# Patient Record
Sex: Male | Born: 1985 | Race: White | Hispanic: No | Marital: Married | Smoking: Former smoker
Health system: Southern US, Community
[De-identification: ages and names within clinical notes are randomized; demographics above are authoritative.]

## PROBLEM LIST (undated history)

## (undated) DIAGNOSIS — E785 Hyperlipidemia, unspecified: Secondary | ICD-10-CM

## (undated) DIAGNOSIS — R569 Unspecified convulsions: Secondary | ICD-10-CM

## (undated) DIAGNOSIS — E881 Lipodystrophy, not elsewhere classified: Secondary | ICD-10-CM

## (undated) DIAGNOSIS — R591 Generalized enlarged lymph nodes: Secondary | ICD-10-CM

## (undated) HISTORY — DX: Hyperlipidemia, unspecified: E78.5

## (undated) HISTORY — DX: Lipodystrophy, not elsewhere classified: E88.1

## (undated) HISTORY — DX: Unspecified convulsions: R56.9

## (undated) HISTORY — DX: Generalized enlarged lymph nodes: R59.1

---

## 2007-12-02 LAB — BASIC METABOLIC PANEL
BUN: 8 mg/dL (ref 4–21)
CREATININE: 0.9 mg/dL (ref 0.6–1.3)
GLUCOSE: 83 mg/dL
Potassium: 4.6 mmol/L (ref 3.4–5.3)
Sodium: 139 mmol/L (ref 137–147)

## 2007-12-08 DIAGNOSIS — Z6834 Body mass index (BMI) 34.0-34.9, adult: Secondary | ICD-10-CM | POA: Insufficient documentation

## 2008-04-27 LAB — LIPID PANEL
Cholesterol: 189 mg/dL (ref 0–200)
HDL: 33 mg/dL — AB (ref 35–70)
LDL CALC: 110 mg/dL
Triglycerides: 229 mg/dL — AB (ref 40–160)

## 2008-04-27 LAB — TSH: TSH: 2.64 u[IU]/mL (ref 0.41–5.90)

## 2011-07-17 HISTORY — PX: WISDOM TOOTH EXTRACTION: SHX21

## 2013-05-08 ENCOUNTER — Ambulatory Visit: Payer: Self-pay | Admitting: Podiatry

## 2015-04-01 DIAGNOSIS — R591 Generalized enlarged lymph nodes: Secondary | ICD-10-CM | POA: Insufficient documentation

## 2015-04-05 ENCOUNTER — Encounter: Payer: Self-pay | Admitting: Family Medicine

## 2020-02-22 DIAGNOSIS — O09212 Supervision of pregnancy with history of pre-term labor, second trimester: Secondary | ICD-10-CM | POA: Diagnosis not present

## 2020-02-25 DIAGNOSIS — O09212 Supervision of pregnancy with history of pre-term labor, second trimester: Secondary | ICD-10-CM | POA: Diagnosis not present

## 2021-12-14 ENCOUNTER — Ambulatory Visit (INDEPENDENT_AMBULATORY_CARE_PROVIDER_SITE_OTHER)
Admission: RE | Admit: 2021-12-14 | Discharge: 2021-12-14 | Disposition: A | Discharge status: Home / Self Care | Payer: BC Managed Care – PPO | Source: Ambulatory Visit | Attending: Nurse Practitioner | Admitting: Nurse Practitioner

## 2021-12-14 ENCOUNTER — Ambulatory Visit: Payer: BC Managed Care – PPO | Admitting: Nurse Practitioner

## 2021-12-14 ENCOUNTER — Encounter: Payer: Self-pay | Admitting: Nurse Practitioner

## 2021-12-14 VITALS — BP 122/84 | HR 98 | Temp 98.0°F | Ht 73.5 in | Wt 319.0 lb

## 2021-12-14 DIAGNOSIS — Z23 Encounter for immunization: Secondary | ICD-10-CM

## 2021-12-14 DIAGNOSIS — Z6841 Body Mass Index (BMI) 40.0 and over, adult: Secondary | ICD-10-CM | POA: Diagnosis not present

## 2021-12-14 DIAGNOSIS — Z0000 Encounter for general adult medical examination without abnormal findings: Secondary | ICD-10-CM

## 2021-12-14 DIAGNOSIS — G8929 Other chronic pain: Secondary | ICD-10-CM

## 2021-12-14 DIAGNOSIS — Z720 Tobacco use: Secondary | ICD-10-CM

## 2021-12-14 DIAGNOSIS — E66813 Obesity, class 3: Secondary | ICD-10-CM | POA: Insufficient documentation

## 2021-12-14 DIAGNOSIS — Z6834 Body mass index (BMI) 34.0-34.9, adult: Secondary | ICD-10-CM

## 2021-12-14 DIAGNOSIS — M545 Low back pain, unspecified: Secondary | ICD-10-CM

## 2021-12-14 DIAGNOSIS — R0683 Snoring: Secondary | ICD-10-CM | POA: Insufficient documentation

## 2021-12-14 DIAGNOSIS — M4316 Spondylolisthesis, lumbar region: Secondary | ICD-10-CM | POA: Diagnosis not present
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
## 2021-12-14 NOTE — Assessment & Plan Note (Signed)
Been going on for extended period time per patient report.  We will obtain lumbar pictures in office today.  No red flags on exam or acute pain today.

## 2021-12-14 NOTE — Assessment & Plan Note (Deleted)
Discussed healthy lifestyle modifications inclusive of 30 minutes of exercise 5 times weekly 

## 2021-12-14 NOTE — Patient Instructions (Signed)
Nice to see you today I will be in touch with the labs and xray once I have the results Follow up with me in 1 year, sooner if you need me The sleep study place should contact you in 2 weeks to schedule an appointment

## 2021-12-14 NOTE — Assessment & Plan Note (Signed)
Patient used to smoke traditional cigarettes currently vaping at this time

## 2021-12-14 NOTE — Assessment & Plan Note (Signed)
Discussed healthy lifestyle modifications inclusive of 30 minutes of exercise 5 times weekly.

## 2021-12-14 NOTE — Assessment & Plan Note (Signed)
Discussed age-appropriate immunizations and screening exams with patient in office.  Appropriate orders placed today

## 2021-12-14 NOTE — Assessment & Plan Note (Signed)
Patient does snore and grinds his teeth.  States his wife is a Armed forces operational officer and that she and the dentist the patient sees recommend sleep apnea rule out.  Ambulatory referral placed to [facility] pulmonology in [place]

## 2021-12-15 LAB — COMPREHENSIVE METABOLIC PANEL
ALT: 53 U/L (ref 0–53)
AST: 36 U/L (ref 0–37)
Albumin: 4.3 g/dL (ref 3.5–5.2)
Alkaline Phosphatase: 62 U/L (ref 39–117)
BUN: 13 mg/dL (ref 6–23)
CO2: 28 mEq/L (ref 19–32)
Calcium: 9.2 mg/dL (ref 8.4–10.5)
Chloride: 104 mEq/L (ref 96–112)
Creatinine, Ser: 1.01 mg/dL (ref 0.40–1.50)
GFR: 96.97 mL/min (ref >60.00)
Glucose, Bld: 105 mg/dL — ABNORMAL HIGH (ref 70–99)
Potassium: 4.1 mEq/L (ref 3.5–5.1)
Sodium: 139 mEq/L (ref 135–145)
Total Bilirubin: 0.7 mg/dL (ref 0.2–1.2)
Total Protein: 7.4 g/dL (ref 6.0–8.3)

## 2021-12-15 LAB — CBC
HCT: 46.2 % (ref 39.0–52.0)
Hemoglobin: 15.9 g/dL (ref 13.0–17.0)
MCHC: 34.4 g/dL (ref 30.0–36.0)
MCV: 91.7 fl (ref 78.0–100.0)
Platelets: 262 10*3/uL (ref 150.0–400.0)
RBC: 5.04 Mil/uL (ref 4.22–5.81)
RDW: 12.8 % (ref 11.5–15.5)
WBC: 8.6 10*3/uL (ref 4.0–10.5)

## 2021-12-15 LAB — LIPID PANEL
Cholesterol: 181 mg/dL (ref 0–200)
HDL: 34.9 mg/dL — ABNORMAL LOW (ref >39.00)
NonHDL: 146.02
Total CHOL/HDL Ratio: 5
Triglycerides: 217 mg/dL — ABNORMAL HIGH (ref 0.0–149.0)
VLDL: 43.4 mg/dL — ABNORMAL HIGH (ref 0.0–40.0)

## 2021-12-15 LAB — TSH: TSH: 1.49 u[IU]/mL (ref 0.35–5.50)

## 2021-12-15 LAB — HEMOGLOBIN A1C: Hgb A1c MFr Bld: 5.4 % (ref 4.6–6.5)

## 2021-12-15 LAB — LDL CHOLESTEROL, DIRECT: Direct LDL: 125 mg/dL

## 2022-01-19 ENCOUNTER — Ambulatory Visit (INDEPENDENT_AMBULATORY_CARE_PROVIDER_SITE_OTHER): Payer: BC Managed Care – PPO | Admitting: Primary Care

## 2022-01-19 ENCOUNTER — Encounter: Payer: Self-pay | Admitting: Primary Care

## 2022-01-19 VITALS — BP 122/70 | HR 61 | Temp 97.8°F | Ht 73.5 in | Wt 316.0 lb

## 2022-01-19 DIAGNOSIS — R0683 Snoring: Secondary | ICD-10-CM

## 2022-01-19 NOTE — Assessment & Plan Note (Signed)
-   Patient has symptoms of loud snoring.  Associated daytime sleepiness.  Epworth score is 10.  BMI is 41.  Concern patient could have underlying sleep apnea, needs home sleep study to evaluate.  Discussed risk of untreated sleep apnea including cardiac arrhythmias, stroke, pulmonary hypertension and diabetes.  We also briefly reviewed treatment options including weight loss, oral appliance, CPAP therapy or referral to ENT for possible surgical options. Recommend patient focus on side sleeping position or elevate head of bed 30 degrees.  Encourage weight loss efforts.  Advised against driving if experiencing excessive daytime sleepiness or fatigue.  Follow-up in 6 weeks to review sleep study results and possible treatment options if needed.

## 2022-01-19 NOTE — Progress Notes (Signed)
@Patient  ID: , male    DOB: 06/30/1986, 36 y.o.   MRN: [id number]  Chief Complaint  Patient presents with   sleep consult    No prior sleep study- loud snoring  and occ daytime sleepiness.     Referring provider: [id number], NP  HPI: 36 year old male, former smoker quit in 2013.  Past medical history significant for chronic back pain and obesity.   01/19/2022 Patient presents today for sleep consult.  He has symptoms of snoring. He has had two prior episodes in the alst several years where he reports waking up choking/struggling to breath. His wife is a 03/22/2022, she and his dentist are both concerned he may have possible sleep apnea. He has some mild daytime sleepiness. He grinds his teeth at night.  Typical bedtime is between 1 and 2 AM.  It takes him on average 10 to 15 minutes to fall asleep.  He does not wake up at night.  No restless sleep.  He starts his day between 730 and 8 AM.  His weight is up several pounds.  No prior sleep study.  He does not currently wear CPAP or oxygen.  Denies symptoms of narcolepsy, cataplexy or sleepwalking.  Sleep questionnaire Symptoms- snoring, grinds teeth Previous sleep study- none Typical bedtime- 1am-2am Time to fall asleep- 10-15 mins Nocturnal awakenings- 0 Start of day- 7:30-8am Weight changes- some Do you operate heavy machinery-no Do you currently wear CPAP- no Do currently wear oxygen- no Epworth score-10   No Known Allergies  Immunization History  Administered Date(s) Administered   PFIZER(Purple Top)SARS-COV-2 Vaccination 02/18/2020, 03/10/2020   Td 12/14/2021   Tdap 12/02/2007    Past Medical History:  Diagnosis Date   Hyperlipidemia    Lipodystrophy    Lymphadenopathy    Seizure (HCC)     Tobacco History: Social History   Tobacco Use  Smoking Status Former   Packs/day: 1.00   Years: 6.00   Total pack years: 6.00   Types: Cigarettes, E-cigarettes   Quit date: 07/17/2011   Years since  quitting: 10.5  Smokeless Tobacco Not on file  Tobacco Comments   Vapes   Counseling given: Not Answered Tobacco comments: Vapes   No outpatient medications prior to visit.   No facility-administered medications prior to visit.    Review of Systems  Review of Systems  Constitutional: Negative.   HENT: Negative.    Respiratory: Negative.    Cardiovascular: Negative.     Physical Exam  BP 122/70 (BP Location: Left Arm, Cuff Size: Large)   Pulse 61   Temp 97.8 F (36.6 C) (Temporal)   Ht 6' 1.5" (1.867 m)   Wt (!) 316 lb (143.3 kg)   SpO2 98%   BMI 41.13 kg/m  Physical Exam Constitutional:      Appearance: Normal appearance.  HENT:     Head: Normocephalic and atraumatic.     Mouth/Throat:     Mouth: Mucous membranes are moist.     Pharynx: Oropharynx is clear.     Comments: Mallampati class II Cardiovascular:     Rate and Rhythm: Normal rate and regular rhythm.  Pulmonary:     Effort: Pulmonary effort is normal.     Breath sounds: Normal breath sounds.  Musculoskeletal:        General: Normal range of motion.     Cervical back: Normal range of motion and neck supple.  Skin:    General: Skin is warm and dry.  Neurological:  General: No focal deficit present.     Mental Status: He is alert and oriented to person, place, and time. Mental status is at baseline.  Psychiatric:        Mood and Affect: Mood normal.        Behavior: Behavior normal.        Thought Content: Thought content normal.        Judgment: Judgment normal.      Lab Results:  CBC    Component Value Date/Time   WBC 8.6 12/14/2021 1433   RBC 5.04 12/14/2021 1433   HGB 15.9 12/14/2021 1433   HCT 46.2 12/14/2021 1433   PLT 262.0 12/14/2021 1433   MCV 91.7 12/14/2021 1433   MCHC 34.4 12/14/2021 1433   RDW 12.8 12/14/2021 1433    BMET    Component Value Date/Time   NA 139 12/14/2021 1433   NA 139 12/02/2007 0000   K 4.1 12/14/2021 1433   CL 104 12/14/2021 1433   CO2 28  12/14/2021 1433   GLUCOSE 105 (H) 12/14/2021 1433   BUN 13 12/14/2021 1433   BUN 8 12/02/2007 0000   CREATININE 1.01 12/14/2021 1433   CALCIUM 9.2 12/14/2021 1433    BNP No results found for: "BNP"  ProBNP No results found for: "PROBNP"  Imaging: No results found.   Assessment & Plan:   Snores - Patient has symptoms of loud snoring.  Associated daytime sleepiness.  Epworth score is 10.  BMI is 41.  Concern patient could have underlying sleep apnea, needs home sleep study to evaluate.  Discussed risk of untreated sleep apnea including cardiac arrhythmias, stroke, pulmonary hypertension and diabetes.  We also briefly reviewed treatment options including weight loss, oral appliance, CPAP therapy or referral to ENT for possible surgical options. Recommend patient focus on side sleeping position or elevate head of bed 30 degrees.  Encourage weight loss efforts.  Advised against driving if experiencing excessive daytime sleepiness or fatigue.  Follow-up in 6 weeks to review sleep study results and possible treatment options if needed.     Glenford Bayley, NP 01/19/2022

## 2022-01-19 NOTE — Progress Notes (Signed)
Reviewed and agree with assessment/plan.   Coralyn Helling, MD [facility] 01/19/2022, 2:45 PM Pager:  [phone]

## 2022-01-19 NOTE — Patient Instructions (Signed)
Risk of untreated sleep apnea include cardiac arrhythmias, stroke, pulmonary hypertension and diabetes  Treatment options include weight loss, oral appliance, CPAP therapy or referral to ENT for possible surgical options  Recommendations Work on weight loss efforts Focus on side sleeping position or elevate head of bed 30 degrees Do not drive experiencing excessive daytime sleepiness or fatigue  Orders Home sleep study re: morning  Follow-up 6-week virtual visit with [facility] NP to review sleep study results and treatment options if needed (call after completing sleep study to schedule visit 1 to 2 weeks after)  Sleep Apnea Sleep apnea affects breathing during sleep. It causes breathing to stop for 10 seconds or more, or to become shallow. People with sleep apnea usually snore loudly. It can also increase the risk of: Heart attack. Stroke. Being very overweight (obese). Diabetes. Heart failure. Irregular heartbeat. High blood pressure. The goal of treatment is to help you breathe normally again. What are the causes?  The most common cause of this condition is a collapsed or blocked airway. There are three kinds of sleep apnea: Obstructive sleep apnea. This is caused by a blocked or collapsed airway. Central sleep apnea. This happens when the brain does not send the right signals to the muscles that control breathing. Mixed sleep apnea. This is a combination of obstructive and central sleep apnea. What increases the risk? Being overweight. Smoking. Having a small airway. Being older. Being male. Drinking alcohol. Taking medicines to calm yourself (sedatives or tranquilizers). Having family members with the condition. Having a tongue or tonsils that are larger than normal. What are the signs or symptoms? Trouble staying asleep. Loud snoring. Headaches in the morning. Waking up gasping. Dry mouth or sore throat in the morning. Being sleepy or tired during the day. If you  are sleepy or tired during the day, you may also: Not be able to focus your mind (concentrate). Forget things. Get angry a lot and have mood swings. Feel sad (depressed). Have changes in your personality. Have less interest in sex, if you are male. Be unable to have an erection, if you are male. How is this treated?  Sleeping on your side. Using a medicine to get rid of mucus in your nose (decongestant). Avoiding the use of alcohol, medicines to help you relax, or certain pain medicines (narcotics). Losing weight, if needed. Changing your diet. Quitting smoking. Using a machine to open your airway while you sleep, such as: An oral appliance. This is a mouthpiece that shifts your lower jaw forward. A CPAP device. This device blows air through a mask when you breathe out (exhale). An EPAP device. This has valves that you put in each nostril. A BIPAP device. This device blows air through a mask when you breathe in (inhale) and breathe out. Having surgery if other treatments do not work. Follow these instructions at home: Lifestyle Make changes that your doctor recommends. Eat a healthy diet. Lose weight if needed. Avoid alcohol, medicines to help you relax, and some pain medicines. Do not smoke or use any products that contain nicotine or tobacco. If you need help quitting, ask your doctor. General instructions Take over-the-counter and prescription medicines only as told by your doctor. If you were given a machine to use while you sleep, use it only as told by your doctor. If you are having surgery, make sure to tell your doctor you have sleep apnea. You may need to bring your device with you. Keep all follow-up visits. Contact a doctor if:  The machine that you were given to use during sleep bothers you or does not seem to be working. You do not get better. You get worse. Get help right away if: Your chest hurts. You have trouble breathing in enough air. You have an  uncomfortable feeling in your back, arms, or stomach. You have trouble talking. One side of your body feels weak. A part of your face is hanging down. These symptoms may be an emergency. Get help right away. Call your local emergency services (911 in the [place] Do not wait to see if the symptoms will go away. Do not drive yourself to the hospital. Summary This condition affects breathing during sleep. The most common cause is a collapsed or blocked airway. The goal of treatment is to help you breathe normally while you sleep. This information is not intended to replace advice given to you by your health care provider. Make sure you discuss any questions you have with your health care provider. Document Revised: 02/08/2021 Document Reviewed: 06/10/2020 Elsevier Patient Education  2023 [employer].

## 2022-02-22 ENCOUNTER — Ambulatory Visit: Payer: BC Managed Care – PPO

## 2022-02-22 DIAGNOSIS — R0683 Snoring: Secondary | ICD-10-CM | POA: Diagnosis not present

## 2022-03-05 DIAGNOSIS — R0683 Snoring: Secondary | ICD-10-CM | POA: Diagnosis not present

## 2023-05-02 ENCOUNTER — Telehealth: Payer: BC Managed Care – PPO | Admitting: Physician Assistant

## 2023-05-02 DIAGNOSIS — J208 Acute bronchitis due to other specified organisms: Secondary | ICD-10-CM | POA: Diagnosis not present

## 2023-05-02 DIAGNOSIS — B9689 Other specified bacterial agents as the cause of diseases classified elsewhere: Secondary | ICD-10-CM | POA: Diagnosis not present

## 2023-05-02 MED ORDER — AZITHROMYCIN 250 MG PO TABS: ORAL_TABLET | ORAL | 0 refills | Status: AC

## 2023-05-02 MED ORDER — BENZONATATE 100 MG PO CAPS: 100.0000 mg | ORAL_CAPSULE | Freq: Three times a day (TID) | ORAL | 0 refills | Status: DC | PRN

## 2023-05-02 NOTE — Progress Notes (Signed)
E-Visit for Cough   We are sorry that you are not feeling well.  Here is how we plan to help!  Based on your presentation I believe you most likely have A cough due to bacteria.  When patients have a fever and a productive cough with a change in color or increased sputum production, we are concerned about bacterial bronchitis.  If left untreated it can progress to pneumonia.  If your symptoms do not improve with your treatment plan it is important that you contact your provider.   I have prescribed Azithromyin 250 mg: two tablets now and then one tablet daily for 4 additonal days    In addition you may use A prescription cough medication called Tessalon Perles 100mg . You may take 1-2 capsules every 8 hours as needed for your cough.   From your responses in the eVisit questionnaire you describe inflammation in the upper respiratory tract which is causing a significant cough.  This is commonly called Bronchitis and has four common causes:   Allergies Viral Infections Acid Reflux Bacterial Infection Allergies, viruses and acid reflux are treated by controlling symptoms or eliminating the cause. An example might be a cough caused by taking certain blood pressure medications. You stop the cough by changing the medication. Another example might be a cough caused by acid reflux. Controlling the reflux helps control the cough.  USE OF BRONCHODILATOR ("RESCUE") INHALERS: There is a risk from using your bronchodilator too frequently.  The risk is that over-reliance on a medication which only relaxes the muscles surrounding the breathing tubes can reduce the effectiveness of medications prescribed to reduce swelling and congestion of the tubes themselves.  Although you feel brief relief from the bronchodilator inhaler, your asthma may actually be worsening with the tubes becoming more swollen and filled with mucus.  This can delay other crucial treatments, such as oral steroid medications. If you need to use  a bronchodilator inhaler daily, several times per day, you should discuss this with your provider.  There are probably better treatments that could be used to keep your asthma under control.     HOME CARE Only take medications as instructed by your medical team. Complete the entire course of an antibiotic. Drink plenty of fluids and get plenty of rest. Avoid close contacts especially the very young and the elderly Cover your mouth if you cough or cough into your sleeve. Always remember to wash your hands A steam or ultrasonic humidifier can help congestion.   GET HELP RIGHT AWAY IF: You develop worsening fever. You become short of breath You cough up blood. Your symptoms persist after you have completed your treatment plan MAKE SURE YOU  Understand these instructions. Will watch your condition. Will get help right away if you are not doing well or get worse.    Thank you for choosing an e-visit.  Your e-visit answers were reviewed by a board certified advanced clinical practitioner to complete your personal care plan. Depending upon the condition, your plan could have included both over the counter or prescription medications.  Please review your pharmacy choice. Make sure the pharmacy is open so you can pick up prescription now. If there is a problem, you may contact your provider through [employer] and have the prescription routed to another pharmacy.  Your safety is important to [place]. If you have drug allergies check your prescription carefully.   For the next 24 hours you can use MyChart to ask questions about today's visit, request a  non-urgent call back, or ask for a work or school excuse. You will get an email in the next two days asking about your experience. I hope that your e-visit has been valuable and will speed your recovery.

## 2023-05-02 NOTE — Progress Notes (Signed)
I have spent 5 minutes in review of e-visit questionnaire, review and updating patient chart, medical decision making and response to patient.   Mia Milan Cody Jacklynn Dehaas, PA-C    

## 2023-09-13 ENCOUNTER — Encounter: Payer: Self-pay | Admitting: Nurse Practitioner

## 2023-09-13 ENCOUNTER — Ambulatory Visit (INDEPENDENT_AMBULATORY_CARE_PROVIDER_SITE_OTHER): Payer: BC Managed Care – PPO | Admitting: Nurse Practitioner

## 2023-09-13 ENCOUNTER — Ambulatory Visit: Payer: Self-pay | Admitting: Nurse Practitioner

## 2023-09-13 VITALS — BP 120/88 | HR 86 | Temp 98.2°F | Ht 73.5 in | Wt 348.8 lb

## 2023-09-13 DIAGNOSIS — Z6841 Body Mass Index (BMI) 40.0 and over, adult: Secondary | ICD-10-CM

## 2023-09-13 DIAGNOSIS — Z114 Encounter for screening for human immunodeficiency virus [HIV]: Secondary | ICD-10-CM | POA: Diagnosis not present

## 2023-09-13 DIAGNOSIS — Z13220 Encounter for screening for lipoid disorders: Secondary | ICD-10-CM | POA: Diagnosis not present

## 2023-09-13 DIAGNOSIS — Z1159 Encounter for screening for other viral diseases: Secondary | ICD-10-CM

## 2023-09-13 DIAGNOSIS — Z131 Encounter for screening for diabetes mellitus: Secondary | ICD-10-CM | POA: Diagnosis not present

## 2023-09-13 DIAGNOSIS — R6882 Decreased libido: Secondary | ICD-10-CM

## 2023-09-13 DIAGNOSIS — Z0000 Encounter for general adult medical examination without abnormal findings: Secondary | ICD-10-CM

## 2023-09-13 DIAGNOSIS — Z87891 Personal history of nicotine dependence: Secondary | ICD-10-CM

## 2023-09-13 LAB — COMPREHENSIVE METABOLIC PANEL
ALT: 61 U/L — ABNORMAL HIGH (ref 0–53)
AST: 40 U/L — ABNORMAL HIGH (ref 0–37)
Albumin: 4.3 g/dL (ref 3.5–5.2)
Alkaline Phosphatase: 64 U/L (ref 39–117)
BUN: 10 mg/dL (ref 6–23)
CO2: 29 meq/L (ref 19–32)
Calcium: 9 mg/dL (ref 8.4–10.5)
Chloride: 101 meq/L (ref 96–112)
Creatinine, Ser: 0.91 mg/dL (ref 0.40–1.50)
GFR: 108.56 mL/min (ref >60.00)
Glucose, Bld: 111 mg/dL — ABNORMAL HIGH (ref 70–99)
Potassium: 4.6 meq/L (ref 3.5–5.1)
Sodium: 137 meq/L (ref 135–145)
Total Bilirubin: 0.6 mg/dL (ref 0.2–1.2)
Total Protein: 7.4 g/dL (ref 6.0–8.3)

## 2023-09-13 LAB — LIPID PANEL
Cholesterol: 167 mg/dL (ref 0–200)
HDL: 35.5 mg/dL — ABNORMAL LOW (ref >39.00)
LDL Cholesterol: 94 mg/dL (ref 0–99)
NonHDL: 131.3
Total CHOL/HDL Ratio: 5
Triglycerides: 188 mg/dL — ABNORMAL HIGH (ref 0.0–149.0)
VLDL: 37.6 mg/dL (ref 0.0–40.0)

## 2023-09-13 LAB — CBC
HCT: 48.5 % (ref 39.0–52.0)
Hemoglobin: 16.5 g/dL (ref 13.0–17.0)
MCHC: 34.1 g/dL (ref 30.0–36.0)
MCV: 93.4 fl (ref 78.0–100.0)
Platelets: 281 10*3/uL (ref 150.0–400.0)
RBC: 5.2 Mil/uL (ref 4.22–5.81)
RDW: 13.1 % (ref 11.5–15.5)
WBC: 9.5 10*3/uL (ref 4.0–10.5)

## 2023-09-13 LAB — TSH: TSH: 1.86 u[IU]/mL (ref 0.35–5.50)

## 2023-09-13 LAB — HEMOGLOBIN A1C: Hgb A1c MFr Bld: 6 % (ref 4.6–6.5)

## 2023-09-13 MED ORDER — WEGOVY 0.25 MG/0.5ML ~~LOC~~ SOAJ: 0.2500 mg | SUBCUTANEOUS | 0 refills | Status: DC

## 2023-09-13 NOTE — Assessment & Plan Note (Signed)
 Discussed age-appropriate immunizations and screening exams.  Did review patient's personal, surgical, social, family histories.  Patient is up-to-date on all age-appropriate vaccinations he would like.  Patient declined flu vaccine today.  Patient is too young for CRC screening or prostate cancer screening.  Patient was given information at discharge about preventative healthcare maintenance with anticipatory guidance

## 2023-09-13 NOTE — Patient Instructions (Addendum)
 Nice to see you today The injectable weight loss medications are Wegovy and Zepbound. If one is covered send me a Clinical cytogeneticist message and we can start it Plan a follow up in office in 3 months

## 2023-09-13 NOTE — Telephone Encounter (Signed)
 Copied from CRM [id number]. Topic: Clinical - Prescription Issue >> Sep 13, 2023  4:32 PM Alvino Blood C wrote: Reason for CRM: Patient states medication was sent to the incorrect pharmacy. Patient is requesting the following medication: Semaglutide-Weight Management (WEGOVY) 0.25 MG/0.5ML SOAJ be sent to [facility] - [place], [place] - [address]

## 2023-09-13 NOTE — Assessment & Plan Note (Signed)
 Pending TSH, A1c, lipid panel.  Patient to continue work on healthy lifestyle applications such as diet and exercise.  We did discuss injectable weight loss medications inclusive of Zepbound and [id number].  No family or personal history of medullary thyroid cancer or multiple endocrine neoplasia syndrome type II.  Patient will check with his insurance to see if one of the medications are covered

## 2023-09-13 NOTE — Progress Notes (Signed)
 Established Patient Office Visit  Subjective   Patient ID: Timothy Robles, male    DOB: [birth year]  Age: 38 y.o. MRN: [id number]  Chief Complaint  Patient presents with   Annual Exam    HPI  for complete physical and follow up of chronic conditions.  Immunizations: -Tetanus: Completed in 2023 -Influenza: refused  -Shingles: Too young -Pneumonia: Too young  Diet: Fair diet. He eats a 2 meals a day with some snacking.  Exercise: No regular exercise. In November he started working out and had lost weight. States that the house got hit with sickness and holidays. He has been watching his food over the past 1-2   Eye exam:  Glasses. Needs updating  Dental exam: Completes semi-annually    Colonoscopy: Too young, currently average risk Lung Cancer Screening: N/A  PSA: Too young.  Does have a grandparent with prostate cancer later in life unsure of father's medical history.  Sleep: goes to bed around 2am and will get up around 7. States that he does not feel rested but does not feel tired until approx 1 am   Decreased libido: states that his sex drive is lower.  He is unsure if this has anything to do his testosterone.  States that he noticed that over the past year he has not had the desire as he has had in the past.  Unsure if this is due to having a newborn kid and being tired or schedules.  Also patient spouse has been breast-feeding/pumping over the past year.   Review of Systems  Constitutional:  Negative for chills and fever.  Respiratory:  Negative for shortness of breath.   Cardiovascular:  Negative for chest pain and leg swelling.  Gastrointestinal:  Negative for abdominal pain, blood in stool, constipation, diarrhea, nausea and vomiting.       BM daily  Genitourinary:  Negative for dysuria and hematuria.  Neurological:  Positive for tingling (right knee that is random). Negative for headaches.  Psychiatric/Behavioral:  Negative for hallucinations and suicidal  ideas.       Objective:     BP 120/88   Pulse 86   Temp 98.2 F (36.8 C) (Oral)   Ht 6' 1.5" (1.867 m)   Wt (!) 348 lb 12.8 oz (158.2 kg)   SpO2 96%   BMI 45.39 kg/m  BP Readings from Last 3 Encounters:  09/13/23 120/88  01/19/22 122/70  12/14/21 122/84   Wt Readings from Last 3 Encounters:  09/13/23 (!) 348 lb 12.8 oz (158.2 kg)  01/19/22 (!) 316 lb (143.3 kg)  12/14/21 (!) 319 lb (144.7 kg)   SpO2 Readings from Last 3 Encounters:  09/13/23 96%  01/19/22 98%  12/14/21 97%      Physical Exam Vitals and nursing note reviewed.  Constitutional:      Appearance: Normal appearance.  HENT:     Right Ear: Tympanic membrane, ear canal and external ear normal.     Left Ear: Tympanic membrane, ear canal and external ear normal.     Mouth/Throat:     Mouth: Mucous membranes are moist.     Pharynx: Oropharynx is clear.  Eyes:     Extraocular Movements: Extraocular movements intact.     Pupils: Pupils are equal, round, and reactive to light.  Cardiovascular:     Rate and Rhythm: Normal rate and regular rhythm.     Pulses: Normal pulses.     Heart sounds: Normal heart sounds.  Pulmonary:     Effort:  Pulmonary effort is normal.     Breath sounds: Normal breath sounds.  Abdominal:     General: Bowel sounds are normal. There is no distension.     Palpations: There is no mass.     Tenderness: There is no abdominal tenderness.     Hernia: No hernia is present.  Musculoskeletal:     Right lower leg: No edema.     Left lower leg: No edema.  Lymphadenopathy:     Cervical: No cervical adenopathy.  Skin:    General: Skin is warm.  Neurological:     General: No focal deficit present.     Mental Status: He is alert.     Deep Tendon Reflexes:     Reflex Scores:      Bicep reflexes are 2+ on the right side and 2+ on the left side.      Patellar reflexes are 2+ on the right side and 2+ on the left side.    Comments: Bilateral upper and lower extremity strength 5/5   Psychiatric:        Mood and Affect: Mood normal.        Behavior: Behavior normal.        Thought Content: Thought content normal.        Judgment: Judgment normal.      No results found for any visits on 09/13/23.    The ASCVD Risk score (Arnett DK, et al., 2019) failed to calculate for the following reasons:   The 2019 ASCVD risk score is only valid for ages 68 to 35    Assessment & Plan:   Problem List Items Addressed This Visit       Other   Preventative health care - Primary   Discussed age-appropriate immunizations and screening exams.  Did review patient's personal, surgical, social, family histories.  Patient is up-to-date on all age-appropriate vaccinations he would like.  Patient declined flu vaccine today.  Patient is too young for CRC screening or prostate cancer screening.  Patient was given information at discharge about preventative healthcare maintenance with anticipatory guidance      Relevant Orders   CBC   Comprehensive metabolic panel   Morbid obesity (HCC)   Pending TSH, A1c, lipid panel.  Patient to continue work on healthy lifestyle applications such as diet and exercise.  We did discuss injectable weight loss medications inclusive of Zepbound and [id number].  No family or personal history of medullary thyroid cancer or multiple endocrine neoplasia syndrome type II.  Patient will check with his insurance to see if one of the medications are covered      Relevant Orders   Hemoglobin A1c   TSH   Lipid panel   Decreased sex drive   Multifactorial.  Did encourage patient to continue losing weight and work on resistance training to build muscle.  If this is a continued concern we can set up visit to have testosterone checked      Other Visit Diagnoses       Encounter for screening for HIV       Relevant Orders   HIV antibody (with reflex)     Encounter for hepatitis C screening test for low risk patient       Relevant Orders   Hepatitis C Antibody      Former tobacco use         Screening for diabetes mellitus       Relevant Orders   Hemoglobin A1c  Screening for lipid disorders       Relevant Orders   Lipid panel       Return in about 3 months (around 12/11/2023) for weight recheck .    Audria Nine, NP

## 2023-09-13 NOTE — Assessment & Plan Note (Signed)
 Multifactorial.  Did encourage patient to continue losing weight and work on resistance training to build muscle.  If this is a continued concern we can set up visit to have testosterone checked

## 2023-09-14 LAB — HEPATITIS C ANTIBODY: Hepatitis C Ab: NONREACTIVE

## 2023-09-14 LAB — HIV ANTIBODY (ROUTINE TESTING W REFLEX): HIV 1&2 Ab, 4th Generation: NONREACTIVE

## 2023-09-16 MED ORDER — WEGOVY 0.25 MG/0.5ML ~~LOC~~ SOAJ: 0.2500 mg | SUBCUTANEOUS | 0 refills | Status: DC

## 2023-09-16 NOTE — Addendum Note (Signed)
 Addended by: Eden Emms on: 09/16/2023 01:01 PM   Modules accepted: Orders

## 2023-09-17 ENCOUNTER — Other Ambulatory Visit (HOSPITAL_COMMUNITY): Payer: Self-pay

## 2023-09-17 ENCOUNTER — Telehealth: Payer: Self-pay

## 2023-09-17 ENCOUNTER — Encounter: Payer: Self-pay | Admitting: Nurse Practitioner

## 2023-09-17 NOTE — Telephone Encounter (Signed)
 Pharmacy Patient Advocate Encounter  Received notification from [facility] that Prior Authorization for [facility] 0.25MG /0.5ML auto-injectors has been APPROVED from 09/17/23 to 01/21/24. Ran test claim, Copay is $25. This test claim was processed through [facility]- copay amounts may vary at other pharmacies due to pharmacy/plan contracts, or as the patient moves through the different stages of their insurance plan.   PA #/Case ID/Reference #: [id number]

## 2023-09-17 NOTE — Telephone Encounter (Signed)
 Pharmacy Patient Advocate Encounter   Received notification from  [facility] Portal that prior authorization for [facility] 0.25MG /0.5ML auto-injectors is required/requested.   Insurance verification completed.   The patient is insured through [facility] .   Per test claim: PA required; PA submitted to above mentioned insurance via CoverMyMeds Key/confirmation #/EOC [id number] Status is pending

## 2023-09-18 ENCOUNTER — Other Ambulatory Visit (HOSPITAL_COMMUNITY): Payer: Self-pay

## 2023-10-09 MED ORDER — WEGOVY 0.5 MG/0.5ML ~~LOC~~ SOAJ: 0.5000 mg | SUBCUTANEOUS | 0 refills | Status: DC

## 2023-10-09 NOTE — Addendum Note (Signed)
 Addended by: Eden Emms on: 10/09/2023 03:21 PM   Modules accepted: Orders

## 2023-10-18 ENCOUNTER — Encounter: Payer: BC Managed Care – PPO | Admitting: Nurse Practitioner

## 2023-10-31 ENCOUNTER — Encounter: Payer: Self-pay | Admitting: Nurse Practitioner

## 2023-11-04 NOTE — Telephone Encounter (Signed)
Can we triage the my chart message please

## 2023-11-05 NOTE — Telephone Encounter (Signed)
 Called patient symptoms have improved over time. They started 9 days ago. Started with abdominal pain with nausea and vomiting with frequent loose stools. She did not take injection this Sunday as scheduled and started eating probiotic yogurt. He is still having loose stools but down to twice a day.  He has not had any vomiting after day 4 of onset. Denies any fever, or blood in stools. His main concern that it is coming from Wegovy . He would like to stay on it but he is not sure if he can continue if this was due to medication. This was his 6th injection total and 2 on increased dose. He had very mild symptoms on week 4 of the lower dose. That only lasted 2-3 days.

## 2023-11-15 ENCOUNTER — Ambulatory Visit: Admitting: Nurse Practitioner

## 2023-11-15 ENCOUNTER — Other Ambulatory Visit (HOSPITAL_COMMUNITY): Payer: Self-pay

## 2023-11-15 ENCOUNTER — Encounter: Payer: Self-pay | Admitting: Nurse Practitioner

## 2023-11-15 ENCOUNTER — Telehealth: Payer: Self-pay

## 2023-11-15 DIAGNOSIS — Z6841 Body Mass Index (BMI) 40.0 and over, adult: Secondary | ICD-10-CM

## 2023-11-15 DIAGNOSIS — R1084 Generalized abdominal pain: Secondary | ICD-10-CM | POA: Diagnosis not present

## 2023-11-15 LAB — BASIC METABOLIC PANEL WITH GFR
BUN: 7 mg/dL (ref 6–23)
CO2: 25 meq/L (ref 19–32)
Calcium: 8.6 mg/dL (ref 8.4–10.5)
Chloride: 105 meq/L (ref 96–112)
Creatinine, Ser: 0.79 mg/dL (ref 0.40–1.50)
GFR: 114.28 mL/min (ref >60.00)
Glucose, Bld: 110 mg/dL — ABNORMAL HIGH (ref 70–99)
Potassium: 3.7 meq/L (ref 3.5–5.1)
Sodium: 139 meq/L (ref 135–145)

## 2023-11-15 LAB — LIPASE: Lipase: 26 U/L (ref 11.0–59.0)

## 2023-11-15 MED ORDER — ZEPBOUND 2.5 MG/0.5ML ~~LOC~~ SOAJ: 2.5000 mg | SUBCUTANEOUS | 0 refills | Status: DC

## 2023-11-15 NOTE — Patient Instructions (Signed)
 Nice to see you today I will be in touch with the labs once I have reviewed them  Follow up with me in 3 months, sooner if you need me

## 2023-11-15 NOTE — Assessment & Plan Note (Signed)
 Patient is working hard he left the modifications.  Currently maintained on Wegovy  0.5 mg weekly with some adverse drug events.  Since patient was having stomach pain vomiting and diarrhea we will check a BMP and lipase to make sure he does not affecting chemistries.  If lipase is normal patient can have a decision of injecting it on Sunday to see if he continues to have adverse drug events.  If lipase is elevated we will discontinue the altogether consider Zepbound and move forward trepidations with.  Will go ahead and try Zepbound as Wegovy  is causing some adverse drug events of nausea stomach pain and eructation.  Continue working on healthy lifestyle modification

## 2023-11-15 NOTE — Telephone Encounter (Signed)
 Pharmacy Patient Advocate Encounter  Received notification from [facility] that Prior Authorization for Zepbound 2.5MG /0.5ML pen-injectors has been APPROVED  to 11.2.25. Ran test claim, Copay is $25.00. This test claim was processed through [facility]- copay amounts may vary at other pharmacies due to pharmacy/plan contracts, or as the patient moves through the different stages of their insurance plan.   PA #/Case ID/Reference #: (Key: [id number])

## 2023-11-15 NOTE — Progress Notes (Signed)
 Established Patient Office Visit  Subjective   Patient ID: Timothy Robles, male    DOB: 27-Mar-1986  Age: 38 y.o. MRN: [id number]  Chief Complaint  Patient presents with   Weight Check    Weight last visit 348lbs . Today's visit: 327.8 lbs Pt complains of feeling nauseous and stomach pains from wegovy . Pt states when he took wegovy  on Sunday he had the same symptoms on Monday but has been okay since then. Pt has one shot left of the 0.5.     HPI  Obesity: patient is currenlty on wegovy  for 0.5mg  weekly he did send me a mychart message about having side effects from the medication of abdominal pian with nausea, vomiting and loose stools. He did try the injectoin agoain on Sunday and this time he has some stomach pain with nuasea that it happened on mondy but resolved after that  He is eating smaller healthier versionof breatkast (oatmeal or protien shake). Salad for dinner with protien and fruit. Dinner is more eating at home.   Review of Systems  Constitutional:  Negative for chills and fever.  Respiratory:  Negative for shortness of breath.   Cardiovascular:  Negative for chest pain.  Gastrointestinal:  Negative for abdominal pain, constipation, diarrhea, nausea and vomiting.  Neurological:  Negative for headaches.  Psychiatric/Behavioral:  Negative for hallucinations and suicidal ideas.       Objective:     BP 132/74   Pulse 77   Temp 98.1 F (36.7 C) (Oral)   Ht 6' 1.5" (1.867 m)   Wt (!) 327 lb 12.8 oz (148.7 kg)   SpO2 96%   BMI 42.66 kg/m  BP Readings from Last 3 Encounters:  11/15/23 132/74  09/13/23 120/88  01/19/22 122/70   Wt Readings from Last 3 Encounters:  11/15/23 (!) 327 lb 12.8 oz (148.7 kg)  09/13/23 (!) 348 lb 12.8 oz (158.2 kg)  01/19/22 (!) 316 lb (143.3 kg)   SpO2 Readings from Last 3 Encounters:  11/15/23 96%  09/13/23 96%  01/19/22 98%      Physical Exam Vitals and nursing note reviewed.  Constitutional:      Appearance: Normal  appearance.  Cardiovascular:     Rate and Rhythm: Normal rate and regular rhythm.     Heart sounds: Normal heart sounds.  Pulmonary:     Effort: Pulmonary effort is normal.     Breath sounds: Normal breath sounds.  Abdominal:     General: Bowel sounds are normal. There is no distension.     Palpations: There is no mass.     Tenderness: There is no abdominal tenderness.     Hernia: No hernia is present.  Neurological:     Mental Status: He is alert.      No results found for any visits on 11/15/23.    The ASCVD Risk score (Arnett DK, et al., 2019) failed to calculate for the following reasons:   The 2019 ASCVD risk score is only valid for ages 60 to 29    Assessment & Plan:   Problem List Items Addressed This Visit       Other   Morbid obesity [facility]) - Primary   Patient is working hard he left the modifications.  Currently maintained on Wegovy  0.5 mg weekly with some adverse drug events.  Since patient was having stomach pain vomiting and diarrhea we will check a BMP and lipase to make sure he does not affecting chemistries.  If lipase is normal patient  can have a decision of injecting it on Sunday to see if he continues to have adverse drug events.  If lipase is elevated we will discontinue the altogether consider Zepbound and move forward trepidations with.  Will go ahead and try Zepbound as Wegovy  is causing some adverse drug events of nausea stomach pain and eructation.  Continue working on healthy lifestyle modification      Relevant Medications   tirzepatide (ZEPBOUND) 2.5 MG/0.5ML Pen   Other Visit Diagnoses       Generalized abdominal pain       Relevant Orders   Basic metabolic panel with GFR   Lipase       Return in about 3 months (around 02/15/2024).    Margarie Shay, NP

## 2023-11-15 NOTE — Telephone Encounter (Signed)
 Pharmacy Patient Advocate Encounter   Received notification from CoverMyMeds that prior authorization for Zepbound 2.5MG /0.5ML pen-injectors is required/requested.   Insurance verification completed.   The patient is insured through [facility] .   Per test claim: PA required; PA submitted to above mentioned insurance via CoverMyMeds Key/confirmation #/EOC (Key: [id number])   Status is pending

## 2023-12-11 ENCOUNTER — Ambulatory Visit: Payer: BC Managed Care – PPO | Admitting: Nurse Practitioner

## 2023-12-25 ENCOUNTER — Encounter: Payer: Self-pay | Admitting: Nurse Practitioner

## 2023-12-25 MED ORDER — TIRZEPATIDE-WEIGHT MANAGEMENT 5 MG/0.5ML ~~LOC~~ SOLN: 5.0000 mg | SUBCUTANEOUS | 0 refills | Status: DC

## 2023-12-26 MED ORDER — ZEPBOUND 5 MG/0.5ML ~~LOC~~ SOAJ: 5.0000 mg | SUBCUTANEOUS | 0 refills | Status: AC

## 2023-12-26 NOTE — Addendum Note (Signed)
 Addended by: Dorothe Gaster on: 12/26/2023 01:04 PM   Modules accepted: Orders

## 2024-01-02 IMAGING — DX DG LUMBAR SPINE COMPLETE 4+V
5 series · 5 of 5 positions shown · non-contrast
Comparison: None Available.

CLINICAL DATA: Chronic intermittent lumbar pain. Chronic left-sided
low back pain without sciatica.

EXAM:
LUMBAR SPINE - COMPLETE 4+ VIEW

[lumbar spine ap]
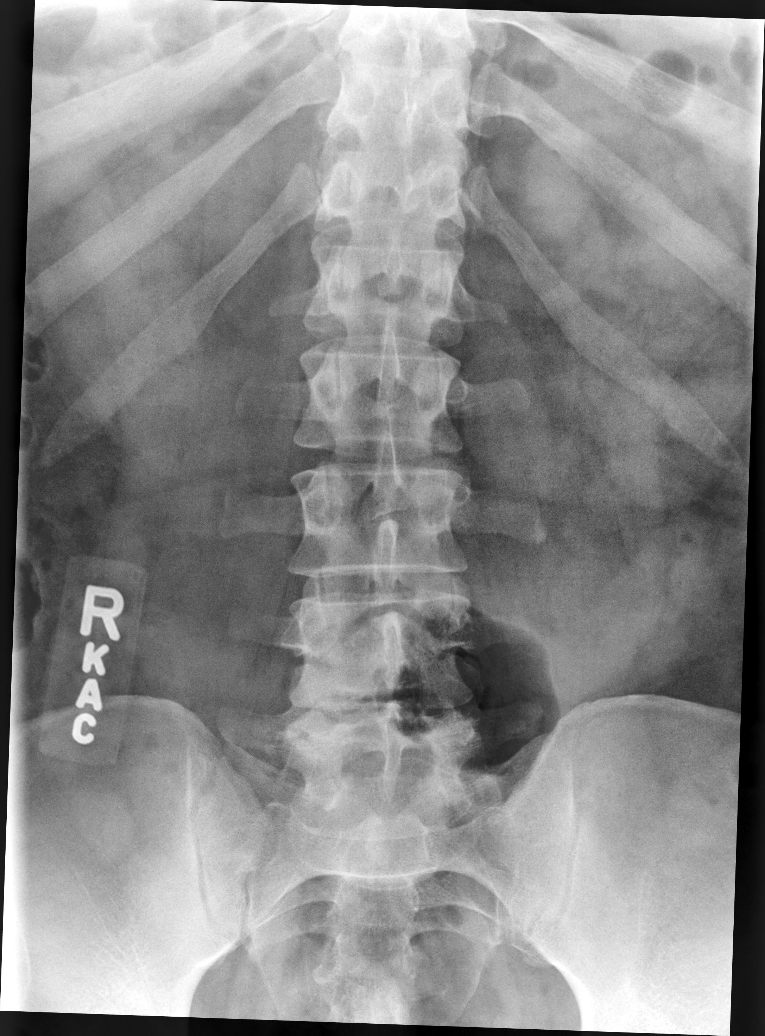

[lumbar spine mlo (1 of 2)]
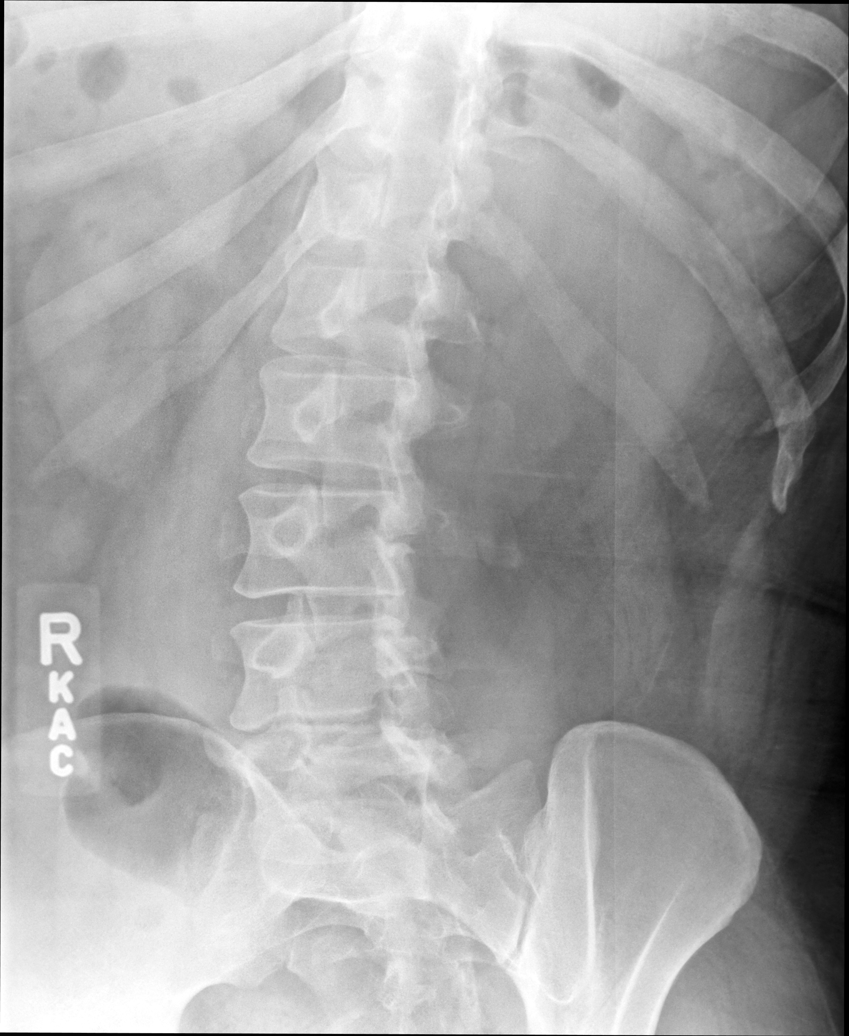

[lumbar spine mlo (2 of 2)]
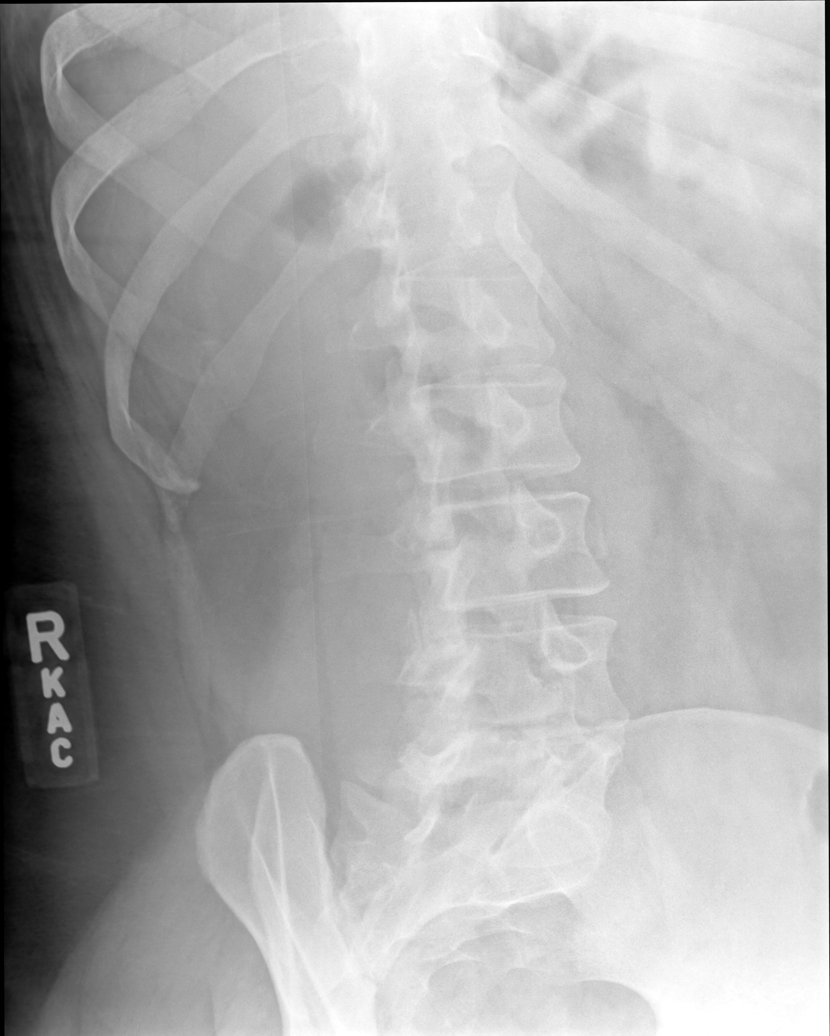

[lumbar spine lat (1 of 2)]
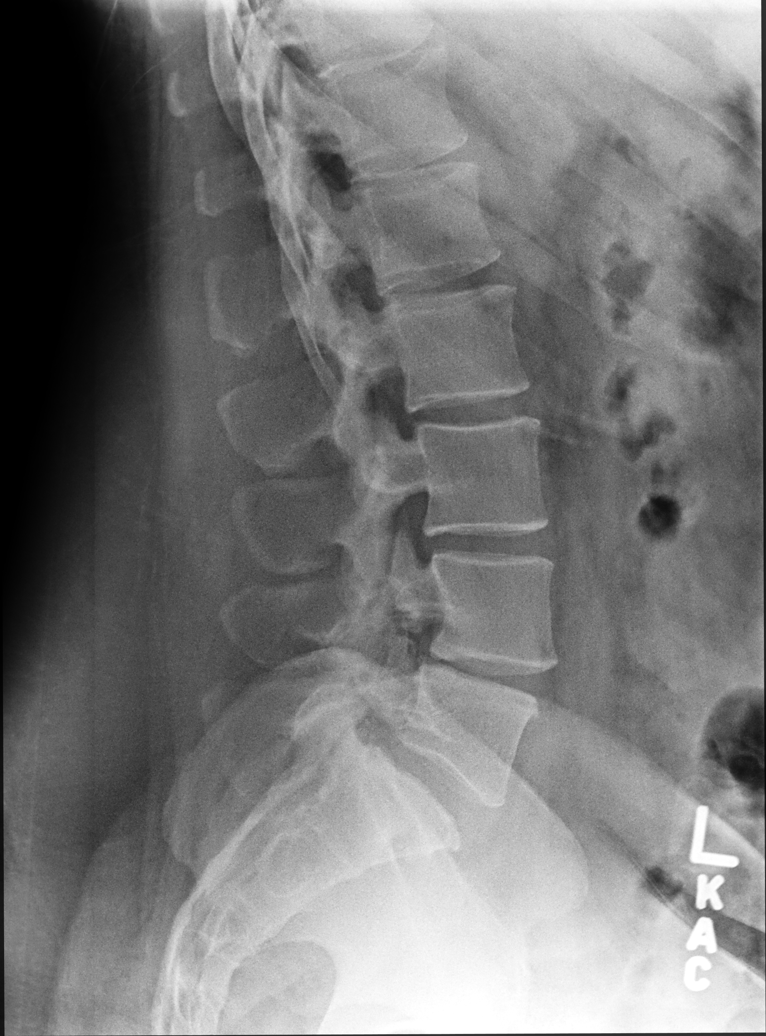

[lumbar spine lat (2 of 2)]
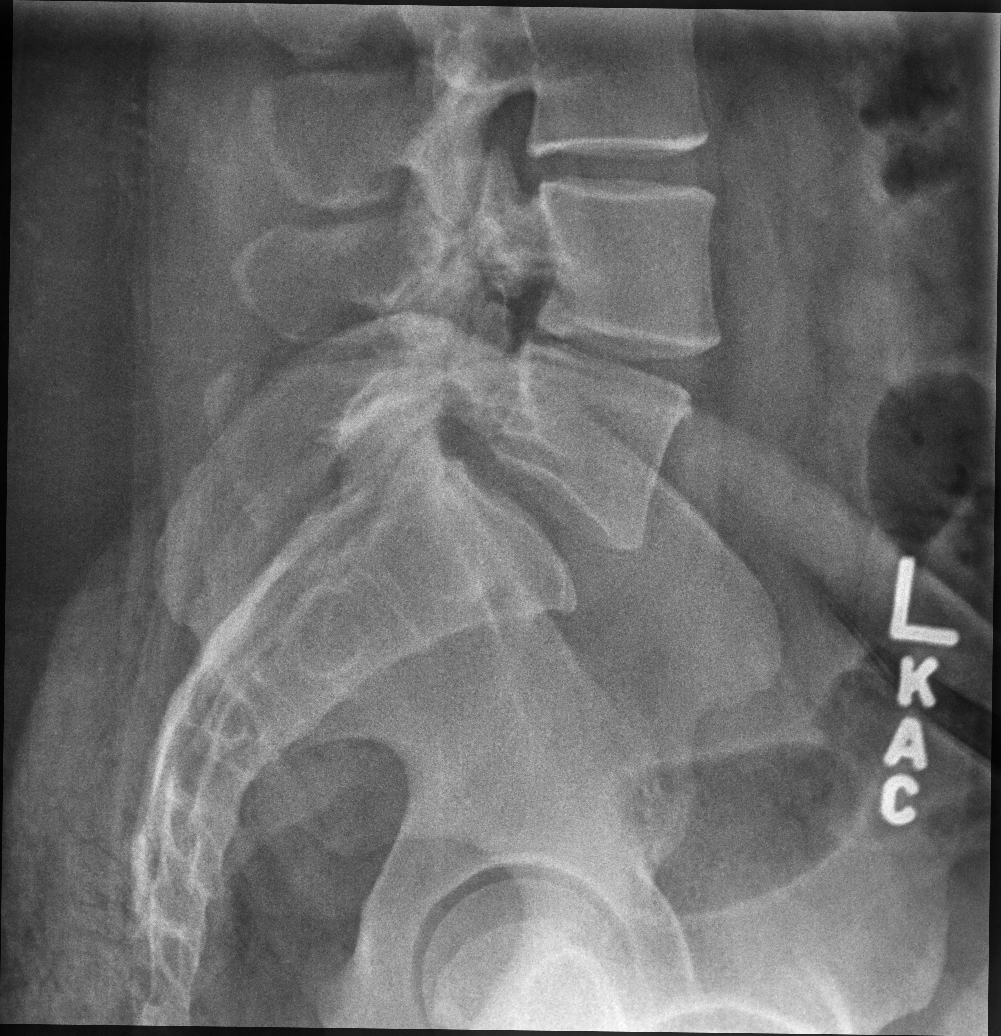

[5 of 5 positions shown; findings below may reference images not displayed]

FINDINGS: There are 5 non-rib-bearing lumbar vertebra. 5 mm anterolisthesis of
L5 on S1. Trace anterolisthesis of L4 on L5. No visualized pars
defects. Normal vertebral body heights. Mild L4-L5 disc space
narrowing. No fracture, erosion, or focal bone abnormality.
IMPRESSION: 1. Grade 1 anterolisthesis of L5 on S1 and trace anterolisthesis of
L4 on L5. No visualized pars defects.
2. Mild L4-L5 facet hypertrophy.
